# Patient Record
Sex: Female | Born: 1960 | Race: Black or African American | Hispanic: No | Smoking: Current every day smoker
Health system: Southern US, Community
[De-identification: ages and names within clinical notes are randomized; demographics above are authoritative.]

---

## 2015-12-25 ENCOUNTER — Emergency Department (HOSPITAL_COMMUNITY)
Admission: EM | Admit: 2015-12-25 | Discharge: 2015-12-25 | Disposition: A | Discharge status: Home / Self Care | Payer: Medicare Other | Attending: Emergency Medicine | Admitting: Emergency Medicine

## 2015-12-25 ENCOUNTER — Emergency Department (HOSPITAL_COMMUNITY): Payer: Medicare Other

## 2015-12-25 ENCOUNTER — Encounter (HOSPITAL_COMMUNITY): Payer: Self-pay | Admitting: Emergency Medicine

## 2015-12-25 DIAGNOSIS — B9689 Other specified bacterial agents as the cause of diseases classified elsewhere: Secondary | ICD-10-CM | POA: Diagnosis not present

## 2015-12-25 DIAGNOSIS — D259 Leiomyoma of uterus, unspecified: Secondary | ICD-10-CM | POA: Diagnosis not present

## 2015-12-25 DIAGNOSIS — N760 Acute vaginitis: Secondary | ICD-10-CM | POA: Diagnosis not present

## 2015-12-25 DIAGNOSIS — K644 Residual hemorrhoidal skin tags: Secondary | ICD-10-CM | POA: Insufficient documentation

## 2015-12-25 DIAGNOSIS — R102 Pelvic and perineal pain: Secondary | ICD-10-CM | POA: Diagnosis present

## 2015-12-25 DIAGNOSIS — Z79899 Other long term (current) drug therapy: Secondary | ICD-10-CM | POA: Diagnosis not present

## 2015-12-25 DIAGNOSIS — F17200 Nicotine dependence, unspecified, uncomplicated: Secondary | ICD-10-CM | POA: Diagnosis not present

## 2015-12-25 LAB — COMPREHENSIVE METABOLIC PANEL
ALBUMIN: 3.5 g/dL (ref 3.5–5.0)
ALT: 20 U/L (ref 14–54)
AST: 28 U/L (ref 15–41)
Alkaline Phosphatase: 68 U/L (ref 38–126)
Anion gap: 8 (ref 5–15)
BUN: 9 mg/dL (ref 6–20)
CHLORIDE: 104 mmol/L (ref 101–111)
CO2: 22 mmol/L (ref 22–32)
CREATININE: 0.71 mg/dL (ref 0.44–1.00)
Calcium: 9.5 mg/dL (ref 8.9–10.3)
GFR calc non Af Amer: >60 mL/min (ref >60)
GLUCOSE: 85 mg/dL (ref 65–99)
Potassium: 3.4 mmol/L — ABNORMAL LOW (ref 3.5–5.1)
SODIUM: 134 mmol/L — AB (ref 135–145)
Total Bilirubin: 0.8 mg/dL (ref 0.3–1.2)
Total Protein: 6.6 g/dL (ref 6.5–8.1)

## 2015-12-25 LAB — URINALYSIS, ROUTINE W REFLEX MICROSCOPIC
Bilirubin Urine: NEGATIVE
Glucose, UA: NEGATIVE mg/dL
HGB URINE DIPSTICK: NEGATIVE
Ketones, ur: NEGATIVE mg/dL
Leukocytes, UA: NEGATIVE
Nitrite: NEGATIVE
Protein, ur: NEGATIVE mg/dL
SPECIFIC GRAVITY, URINE: 1.005 (ref 1.005–1.030)
pH: 5.5 (ref 5.0–8.0)

## 2015-12-25 LAB — CBC WITH DIFFERENTIAL/PLATELET
BASOS ABS: 0 10*3/uL (ref 0.0–0.1)
BASOS PCT: 1 %
Eosinophils Absolute: 0.1 10*3/uL (ref 0.0–0.7)
Eosinophils Relative: 3 %
HEMATOCRIT: 40.2 % (ref 36.0–46.0)
HEMOGLOBIN: 13.8 g/dL (ref 12.0–15.0)
LYMPHS PCT: 36 %
Lymphs Abs: 1.5 10*3/uL (ref 0.7–4.0)
MCH: 31.7 pg (ref 26.0–34.0)
MCHC: 34.3 g/dL (ref 30.0–36.0)
MCV: 92.4 fL (ref 78.0–100.0)
Monocytes Absolute: 0.4 10*3/uL (ref 0.1–1.0)
Monocytes Relative: 9 %
NEUTROS ABS: 2.1 10*3/uL (ref 1.7–7.7)
NEUTROS PCT: 51 %
Platelets: 158 10*3/uL (ref 150–400)
RBC: 4.35 MIL/uL (ref 3.87–5.11)
RDW: 13.9 % (ref 11.5–15.5)
WBC: 4.1 10*3/uL (ref 4.0–10.5)

## 2015-12-25 LAB — WET PREP, GENITAL
SPERM: NONE SEEN
TRICH WET PREP: NONE SEEN
YEAST WET PREP: NONE SEEN

## 2015-12-25 LAB — I-STAT BETA HCG BLOOD, ED (MC, WL, AP ONLY): I-stat hCG, quantitative: <5 m[IU]/mL (ref <5)

## 2015-12-25 LAB — LIPASE, BLOOD: Lipase: 25 U/L (ref 11–51)

## 2015-12-25 MED ORDER — METRONIDAZOLE 500 MG PO TABS: 500.0000 mg | ORAL_TABLET | Freq: Two times a day (BID) | ORAL | 0 refills | Status: AC

## 2015-12-25 MED ORDER — PERMETHRIN 5 % EX CREA: TOPICAL_CREAM | CUTANEOUS | 0 refills | Status: AC

## 2015-12-25 MED ORDER — DIBUCAINE 1 % RE OINT: 1.0000 "application " | TOPICAL_OINTMENT | RECTAL | 0 refills | Status: AC | PRN

## 2015-12-25 MED ORDER — PSYLLIUM 28 % PO PACK: 1.0000 | PACK | Freq: Two times a day (BID) | ORAL | 0 refills | Status: AC

## 2015-12-25 MED ORDER — IBUPROFEN 600 MG PO TABS
600.0000 mg | ORAL_TABLET | Freq: Four times a day (QID) | ORAL | 0 refills | Status: AC | PRN
Start: 2015-12-25 — End: ?

## 2015-12-25 NOTE — ED Notes (Signed)
Pt taken to US

## 2015-12-25 NOTE — ED Notes (Signed)
Pt returned from ultrasound

## 2015-12-25 NOTE — ED Provider Notes (Signed)
[facility] DEPT Provider Note   CSN: [id number] Arrival date & time: 12/25/15  1712     History   Chief Complaint Chief Complaint  Patient presents with  . Abdominal Pain  . Rectal Pain    HPI Tanya Wood is a 55 y.o. female.  HPI   Patient is a 55 year old female with no pertinent past medical history presents the ED with complaint of pelvic pain, onset 3 days. Patient reports having constant sharp pain to her lower abdomen/pelvic region for the past few days. Denies taking any medication for symptoms. She also reports having a burning pain around her rectum, endorses history of hemorrhoids but denies currently using any medications. Patient also reports associated dysuria and white vaginal discharge. She notes today she began having nausea with 2 episodes of NBNB vomiting and 2 episodes of nonbloody diarrhea. Patient denies any recent antibiotic use, recent hospitalizations, recent travel onset of the [place], drinking from fresh water sources or any recent sick contacts. Denies fever, chills, cough, shortness of breath, chest pain, hematemesis, blood in urine or stool, vaginal bleeding, rash. She is not sexually active. LMP 10/31. Endorses abdominal surgical history of tubal ligation. She also notes she has been seen at an urgent care and [facility] ED over the past few days for her current symptoms.   History reviewed. No pertinent past medical history.  There are no active problems to display for this patient.   History reviewed. No pertinent surgical history.  OB History    No data available       Home Medications    Prior to Admission medications   Medication Sig Start Date End Date Taking? Authorizing Provider  albuterol (PROVENTIL HFA;VENTOLIN HFA) 108 (90 Base) MCG/ACT inhaler Inhale 1 puff into the lungs every 6 (six) hours as needed for wheezing or shortness of breath.   Yes Historical Provider, MD  beclomethasone (QVAR) 40 MCG/ACT inhaler Inhale 1 puff into the  lungs 2 (two) times daily.   Yes Historical Provider, MD  calcium citrate-vitamin D (CITRACAL+D) 315-200 MG-UNIT tablet Take 1 tablet by mouth 2 (two) times daily.   Yes Historical Provider, MD  ferrous sulfate 325 (65 FE) MG tablet Take 325 mg by mouth daily with breakfast.   Yes Historical Provider, MD  gabapentin (NEURONTIN) 300 MG capsule Take 300 mg by mouth 2 (two) times daily.   Yes Historical Provider, MD  hydrochlorothiazide (MICROZIDE) 12.5 MG capsule Take 12.5 mg by mouth daily.   Yes Historical Provider, MD  hydrocortisone 2.5 % cream Apply 1 application topically daily as needed. For rash   Yes Historical Provider, MD  PRESCRIPTION MEDICATION Hdrocortisone 2.5% Apply topically twice a day for 14 days   Yes Historical Provider, MD  QUEtiapine (SEROQUEL) 400 MG tablet Take 200 mg by mouth at bedtime.   Yes Historical Provider, MD  venlafaxine XR (EFFEXOR-XR) 75 MG 24 hr capsule Take 75 mg by mouth daily with breakfast.   Yes Historical Provider, MD  dibucaine (NUPERCAINAL) 1 % OINT Place 1 application rectally as needed for pain. 12/25/15   Nona Dell, PA-C  ibuprofen (ADVIL,MOTRIN) 600 MG tablet Take 1 tablet (600 mg total) by mouth every 6 (six) hours as needed. 12/25/15   Nona Dell, PA-C  metroNIDAZOLE (FLAGYL) 500 MG tablet Take 1 tablet (500 mg total) by mouth 2 (two) times daily. 12/25/15   Nona Dell, PA-C  permethrin Nancy Fetter) 5 % cream Apply to affected area once 12/25/15   Nona Dell, PA-C  psyllium (METAMUCIL SMOOTH TEXTURE) 28 % packet Take 1 packet by mouth 2 (two) times daily. 12/25/15   Nona Dell, PA-C    Family History History reviewed. No pertinent family history.  Social History Social History  Substance Use Topics  . Smoking status: Current Every Day Smoker  . Smokeless tobacco: Never Used  . Alcohol use Yes     Allergies   Penicillins   Review of Systems Review of Systems    Gastrointestinal: Positive for abdominal pain, diarrhea, nausea, rectal pain and vomiting.  Genitourinary: Positive for dysuria, pelvic pain and vaginal discharge.  All other systems reviewed and are negative.    Physical Exam Updated Vital Signs BP 138/95 (BP Location: Left Arm)   Pulse 102   Temp 98.1 F (36.7 C) (Oral)   Resp 18   Ht 5\' 2"  (1.575 m)   Wt 52.6 kg   SpO2 100%   BMI 21.22 kg/m   Physical Exam  Constitutional: She is oriented to person, place, and time. She appears well-developed and well-nourished.  HENT:  Head: Normocephalic and atraumatic.  Mouth/Throat: Uvula is midline, oropharynx is clear and moist and mucous membranes are normal. No oropharyngeal exudate, posterior oropharyngeal edema, posterior oropharyngeal erythema or tonsillar abscesses. No tonsillar exudate.  Eyes: Conjunctivae and EOM are normal. Right eye exhibits no discharge. Left eye exhibits no discharge. No scleral icterus.  Neck: Normal range of motion. Neck supple.  Cardiovascular: Regular rhythm, normal heart sounds and intact distal pulses.   Mildly tachycardic, HR 103  Pulmonary/Chest: Effort normal and breath sounds normal. No respiratory distress. She has no wheezes. She has no rales. She exhibits no tenderness.  Abdominal: Soft. Bowel sounds are normal. She exhibits no distension and no mass. There is no tenderness. There is no rebound and no guarding. No hernia.  No CVA tenderness  Genitourinary: Rectal exam shows external hemorrhoid. Rectal exam shows no internal hemorrhoid, no fissure, no mass, no tenderness and anal tone normal. There is no rash, tenderness, lesion or injury on the right labia. There is no rash, tenderness, lesion or injury on the left labia.  Genitourinary Comments: 4 small nonthrombosed hemorrhoids noted to all sides of the rectum, nontender, no active bleeding.   Mild TTP over mons pubis, no rash, swelling, erythema present.  Musculoskeletal: She exhibits no  edema.  Neurological: She is alert and oriented to person, place, and time.  Skin: Skin is warm and dry.  Nursing note and vitals reviewed.  Pelvic exam: normal external genitalia, vulva, vagina, cervix, uterus and adnexa, VULVA: normal appearing vulva with no masses, tenderness or lesions, VAGINA: normal appearing vagina with normal color and discharge, no lesions, vaginal discharge - clear, white and mucoid, CERVIX: normal appearing cervix without discharge or lesions, WET MOUNT done - results: clue cells, white blood cells, DNA probe for chlamydia and GC obtained, UTERUS: uterus is normal size, shape, consistency and nontender, ADNEXA: normal adnexa in size, nontender and no masses, tenderness left, exam chaperoned by female tech.   ED Treatments / Results  Labs (all labs ordered are listed, but only abnormal results are displayed) Labs Reviewed  WET PREP, GENITAL - Abnormal; Notable for the following:       Result Value   Clue Cells Wet Prep HPF POC FEW (*)    WBC, Wet Prep HPF POC MODERATE (*)    All other components within normal limits  COMPREHENSIVE METABOLIC PANEL - Abnormal; Notable for the following:    Sodium 134 (*)  Potassium 3.4 (*)    All other components within normal limits  CBC WITH DIFFERENTIAL/PLATELET  LIPASE, BLOOD  URINALYSIS, ROUTINE W REFLEX MICROSCOPIC (NOT AT [facility])  RPR  HIV ANTIBODY (ROUTINE TESTING)  I-STAT BETA HCG BLOOD, ED ([facility], [facility], [facility] ONLY)  GC/CHLAMYDIA PROBE AMP ([place]) NOT AT [facility]    EKG  EKG Interpretation None       Radiology US Transvaginal Non-ob  Result Date: 12/25/2015 CLINICAL DATA:  Abdominal and pelvic pain for 3 days. EXAM: TRANSABDOMINAL AND TRANSVAGINAL ULTRASOUND OF PELVIS TECHNIQUE: Both transabdominal and transvaginal ultrasound examinations of the pelvis were performed. Transabdominal technique was performed for global imaging of the pelvis including uterus, ovaries, adnexal regions, and pelvic cul-de-sac. It was  necessary to proceed with endovaginal exam following the transabdominal exam to visualize the endometrium and ovaries. COMPARISON:  None FINDINGS: Uterus Measurements: 8.4 x 6.9 x 7.1 cm. Multiple calcified fibroids are present, measuring up to 3.2 cm. Endometrium Thickness: 6 mm. Diminished visibility of the endometrium due to acoustic degradation from the fibroids. No focal endometrial abnormalities are evident. Right ovary Not visible. Left ovary Not visible. Other findings No abnormal free fluid. IMPRESSION: Enlarged multi fibroid uterus. Nonvisualization of the ovaries. No abnormal pelvic fluid collections. Electronically Signed   By: Andreas Newport M.D.   On: 12/25/2015 22:10   US Pelvis Complete  Result Date: 12/25/2015 CLINICAL DATA:  Abdominal and pelvic pain for 3 days. EXAM: TRANSABDOMINAL AND TRANSVAGINAL ULTRASOUND OF PELVIS TECHNIQUE: Both transabdominal and transvaginal ultrasound examinations of the pelvis were performed. Transabdominal technique was performed for global imaging of the pelvis including uterus, ovaries, adnexal regions, and pelvic cul-de-sac. It was necessary to proceed with endovaginal exam following the transabdominal exam to visualize the endometrium and ovaries. COMPARISON:  None FINDINGS: Uterus Measurements: 8.4 x 6.9 x 7.1 cm. Multiple calcified fibroids are present, measuring up to 3.2 cm. Endometrium Thickness: 6 mm. Diminished visibility of the endometrium due to acoustic degradation from the fibroids. No focal endometrial abnormalities are evident. Right ovary Not visible. Left ovary Not visible. Other findings No abnormal free fluid. IMPRESSION: Enlarged multi fibroid uterus. Nonvisualization of the ovaries. No abnormal pelvic fluid collections. Electronically Signed   By: Andreas Newport M.D.   On: 12/25/2015 22:10    Procedures Procedures (including critical care time)  Medications Ordered in ED Medications - No data to display   Initial Impression /  Assessment and Plan / ED Course  I have reviewed the triage vital signs and the nursing notes.  Pertinent labs & imaging results that were available during my care of the patient were reviewed by me and considered in my medical decision making (see chart for details).  Clinical Course     Patient presents with pelvic pain and vaginal discharge and rectal pain. Also reports having episodes of nausea, vomiting and diarrhea that started today. Denies fever. VSS. Exam revealed no abdominal tenderness, multiple nonthrombosed external hemorrhoids noted on rectal exam. White mucus discharge present on pelvic exam with TTP over left adnexa, no CMT. Pt declined IVF or nausea medications. Pregnancy negative. UA and labs unremarkable. Wet prep positive for clue cells and WBCs. Pelvic ultrasound revealed enlarged multi fibroid uterus. Patient tolerating PO in the ED. suspect patient's symptoms are likely related to uterine fibroid, BV and external hemorrhoids. Plan to discharge patient home with Flagyl, NSAIDs and dibucaine ointment along with symptomatic treatment for hemorrhoids. Patient given information to follow-up with gynecology regarding her uterine fibroid. Discussed return precautions.  Final Clinical Impressions(s) / ED Diagnoses   Final diagnoses:  Left adnexal tenderness  BV (bacterial vaginosis)  Uterine leiomyoma, unspecified location  External hemorrhoid    New Prescriptions Discharge Medication List as of 12/25/2015 11:07 PM    START taking these medications   Details  dibucaine (NUPERCAINAL) 1 % OINT Place 1 application rectally as needed for pain., Starting Mon 12/25/2015, Print    ibuprofen (ADVIL,MOTRIN) 600 MG tablet Take 1 tablet (600 mg total) by mouth every 6 (six) hours as needed., Starting Mon 12/25/2015, Print    metroNIDAZOLE (FLAGYL) 500 MG tablet Take 1 tablet (500 mg total) by mouth 2 (two) times daily., Starting Mon 12/25/2015, Print    psyllium (METAMUCIL SMOOTH  TEXTURE) 28 % packet Take 1 packet by mouth 2 (two) times daily., Starting Mon 12/25/2015, Print         Chesley Noon [place], [place] 12/25/15 2333    Carmin Muskrat, MD 12/26/15 Shelah Lewandowsky

## 2015-12-25 NOTE — ED Triage Notes (Signed)
Pt sts lower abd pain with pain in vaginal and rectal area x 3 days; pt sts seen x 3 for same over last several days

## 2015-12-25 NOTE — Discharge Instructions (Signed)
Take your medications as prescribed until completed. I recommend following up with the gynecology clinic listed below for further management of your uterine fibroids. Please return to the Emergency Department if symptoms worsen or new onset of fever, abdominal pain, vomiting, and he received fluids down, rectal bleeding.

## 2015-12-26 LAB — RPR: RPR Ser Ql: NONREACTIVE

## 2015-12-26 LAB — GC/CHLAMYDIA PROBE AMP (~~LOC~~) NOT AT ARMC
CHLAMYDIA, DNA PROBE: NEGATIVE
NEISSERIA GONORRHEA: NEGATIVE

## 2015-12-26 LAB — HIV ANTIBODY (ROUTINE TESTING W REFLEX): HIV Screen 4th Generation wRfx: NONREACTIVE

## 2017-04-05 IMAGING — US US PELVIS COMPLETE
1 series · 14 of 25 positions shown · non-contrast
Comparison: None

CLINICAL DATA: Abdominal and pelvic pain for 3 days.

EXAM:
TRANSABDOMINAL AND TRANSVAGINAL ULTRASOUND OF PELVIS
TECHNIQUE: Both transabdominal and transvaginal ultrasound examinations of the
pelvis were performed. Transabdominal technique was performed for
global imaging of the pelvis including uterus, ovaries, adnexal
regions, and pelvic cul-de-sac. It was necessary to proceed with
endovaginal exam following the transabdominal exam to visualize the
endometrium and ovaries.

[Series 1: us pelvis complete · 0.19mm/px · 14 of 45 slices shown]
[im 1/45]
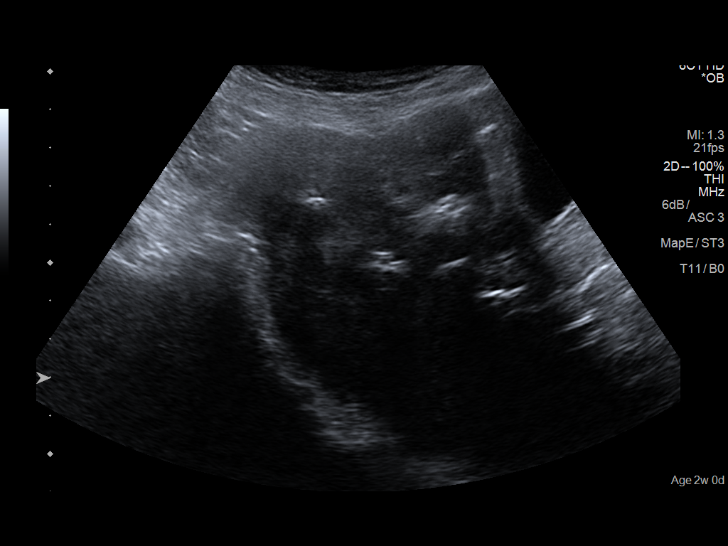
[im 4/45]
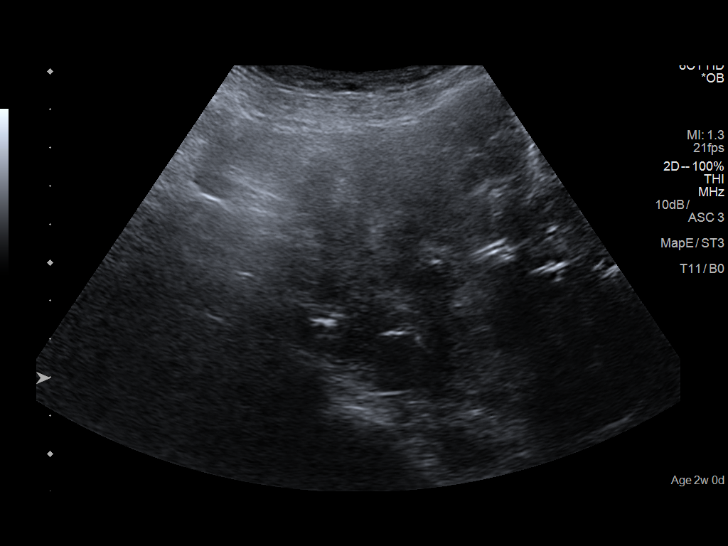
[im 8/45]
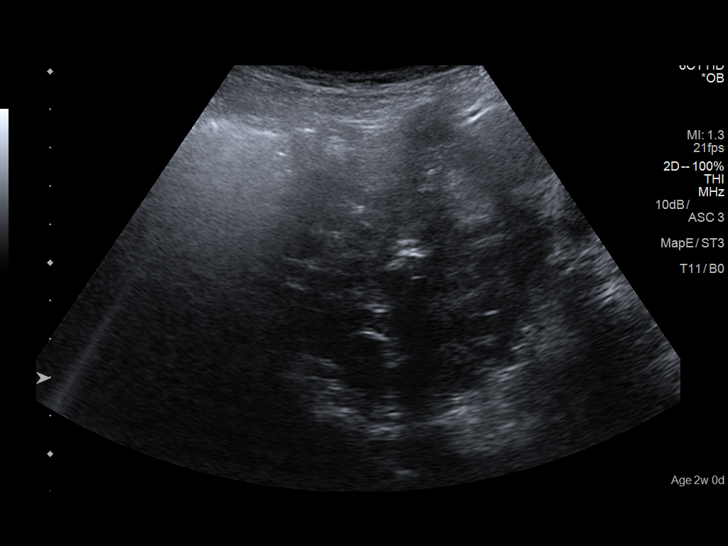
[im 12/45]
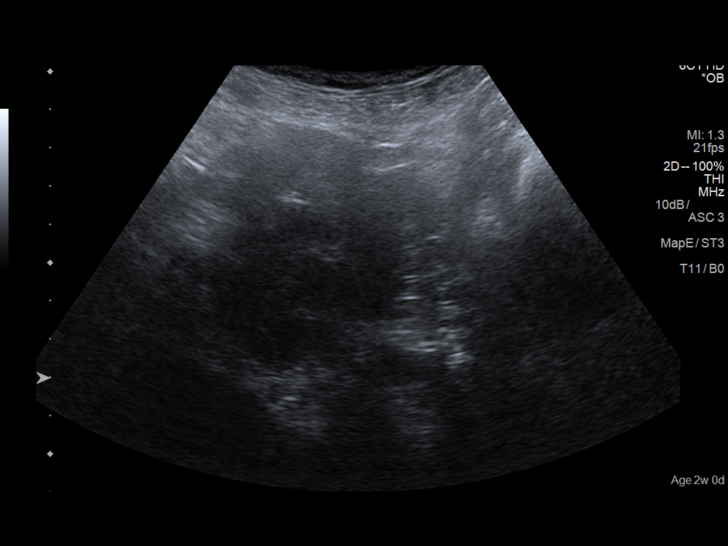
[im 15/45]
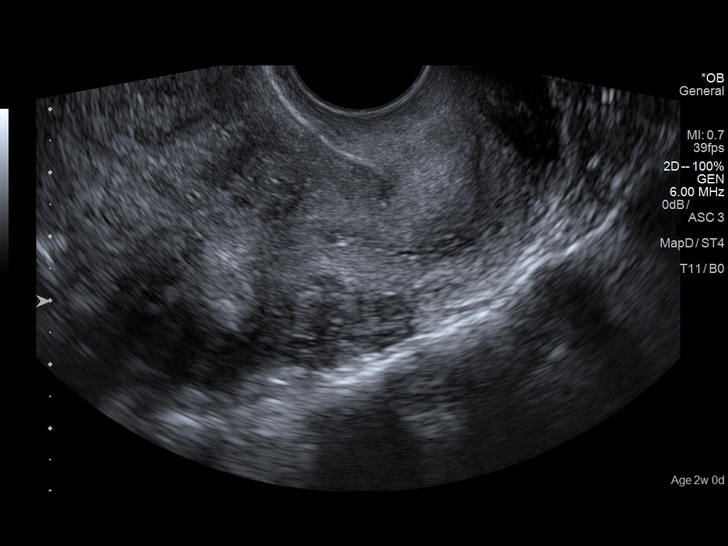
[im 17/45]
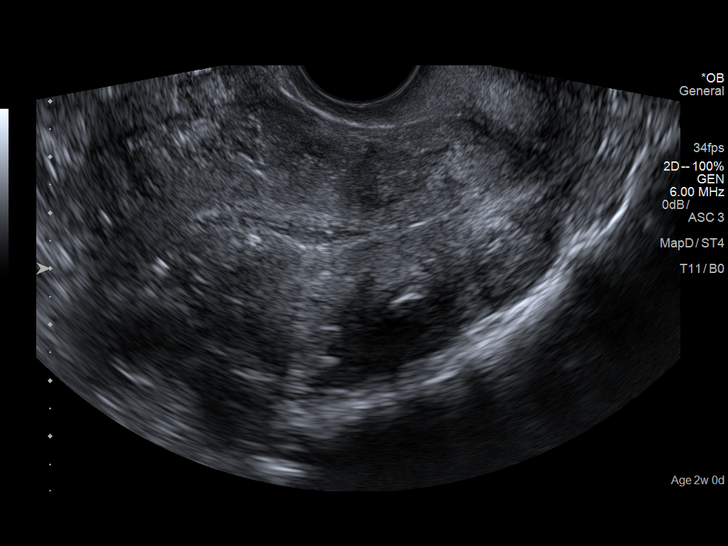
[im 21/45]
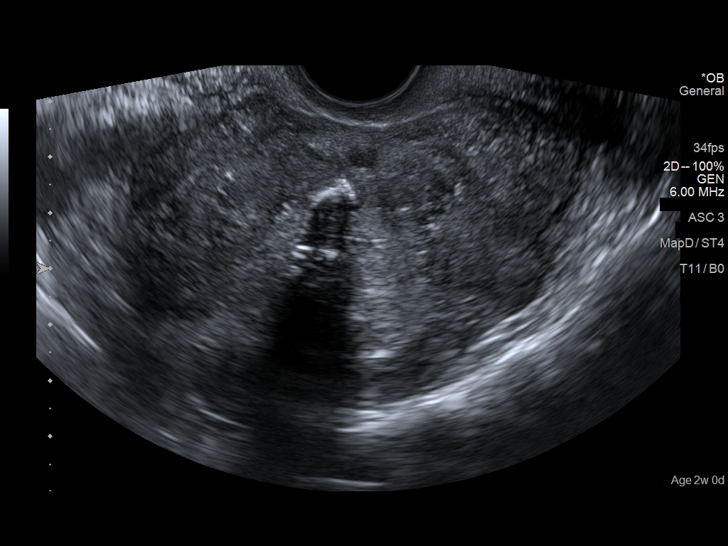
[im 24/45]
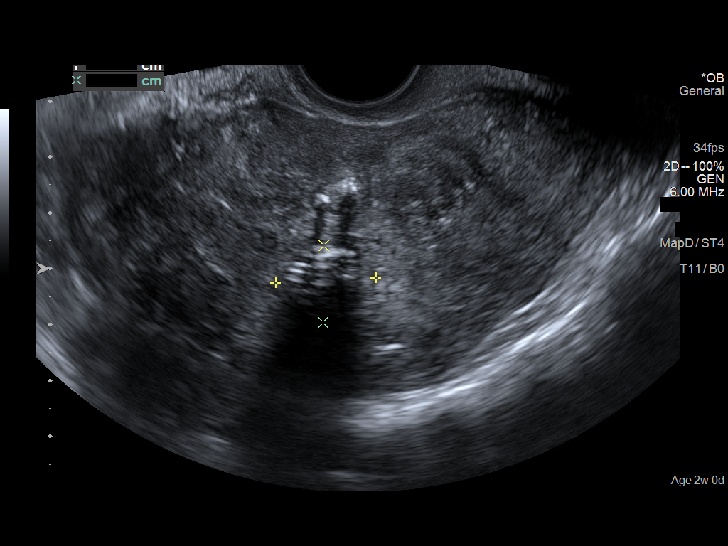
[im 28/45]
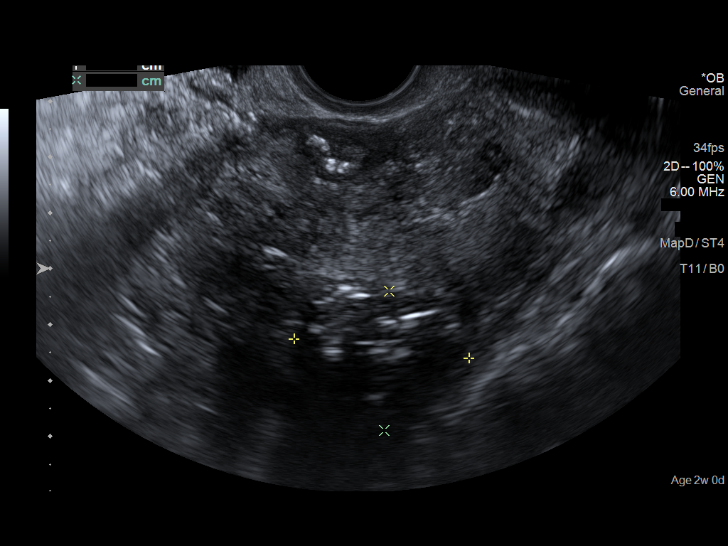
[im 30/45]
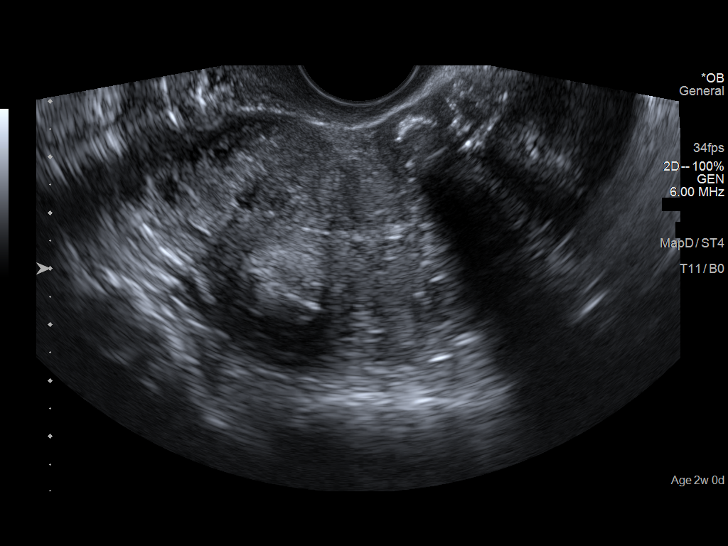
[im 34/45]
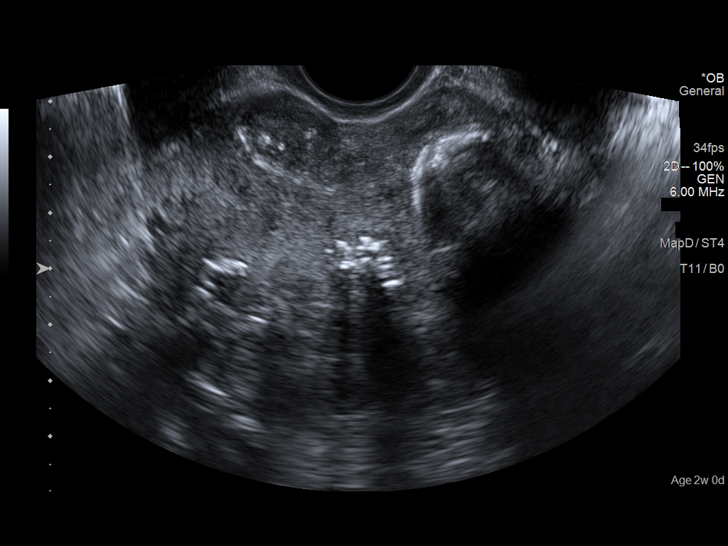
[im 37/45]
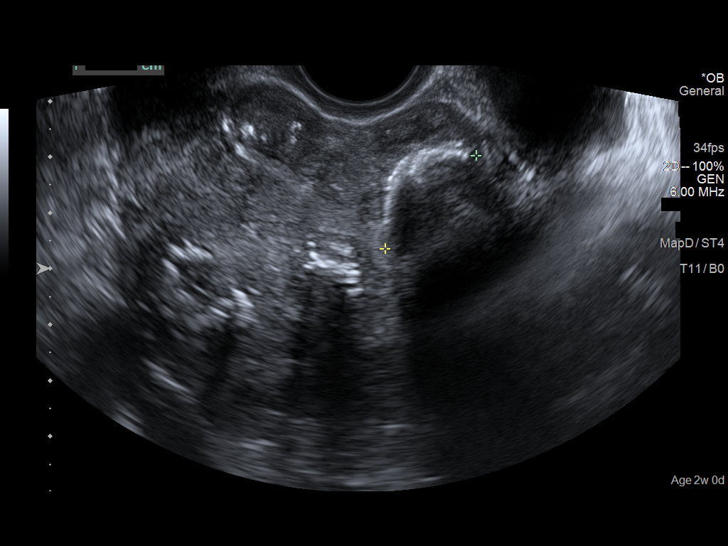
[im 41/45]
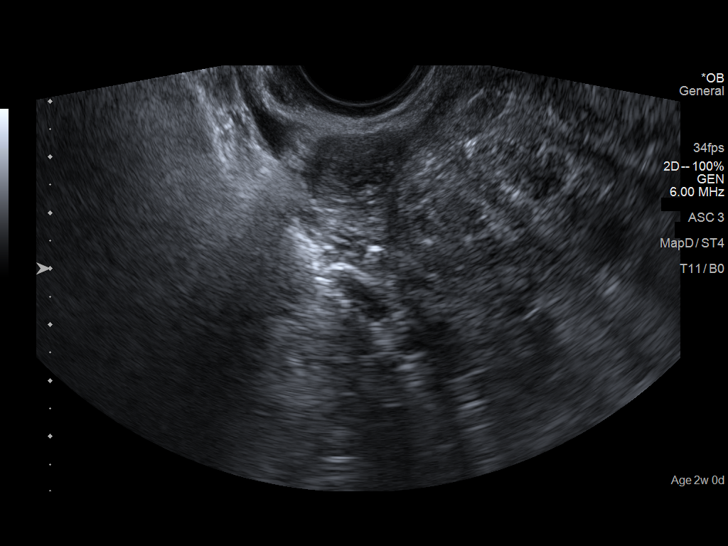
[im 45/45]
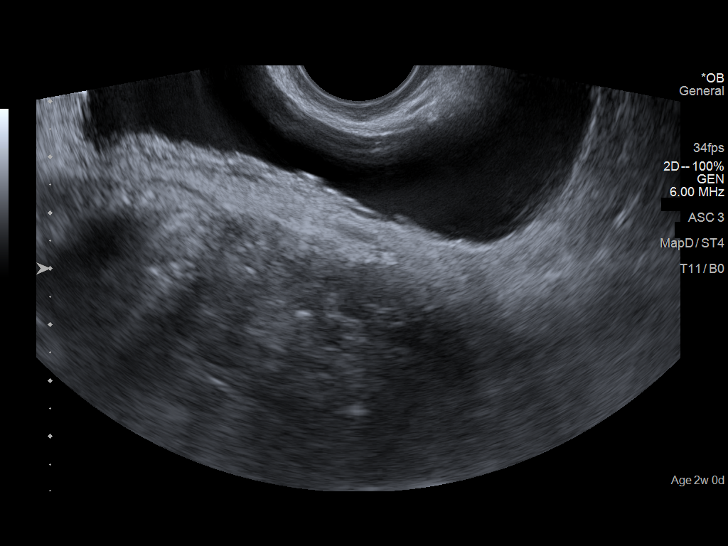

[14 of 25 positions shown; findings below may reference images not displayed]

FINDINGS: Uterus

Measurements: 8.4 x 6.9 x 7.1 cm. Multiple calcified fibroids are
present, measuring up to 3.2 cm.

Endometrium

Thickness: 6 mm. Diminished visibility of the endometrium due to
acoustic degradation from the fibroids. No focal endometrial
abnormalities are evident.

Right ovary

Not visible.

Left ovary

Not visible.

Other findings

No abnormal free fluid.
IMPRESSION: Enlarged multi fibroid uterus. Nonvisualization of the ovaries. No
abnormal pelvic fluid collections.
